# Patient Record
Sex: Female | Born: 1960 | Race: White | Hispanic: No | Marital: Single | Smoking: Former smoker
Health system: Southern US, Community
[De-identification: ages and names within clinical notes are randomized; demographics above are authoritative.]

---

## 2006-06-03 ENCOUNTER — Emergency Department: Payer: Self-pay | Admitting: Emergency Medicine

## 2007-07-26 ENCOUNTER — Emergency Department: Payer: Self-pay | Admitting: Emergency Medicine

## 2008-03-23 IMAGING — CR NASAL BONES - 3+ VIEW
1 series · 3 of 3 positions shown · non-contrast
Comparison: none

REASON FOR EXAM: TRIPPED ON HER CAT AND INJURED HER NOSE MINOR CARE ROOM
TWO
COMMENTS:

PROCEDURE:     DXR - DXR NASAL BONES  - June 03, 2006 [DATE]
RESULT:     No prior studies are available for comparison.

[Series 1: view not recorded · 0.17mm/px · 3 of 3 slices shown]
[im 1/3]
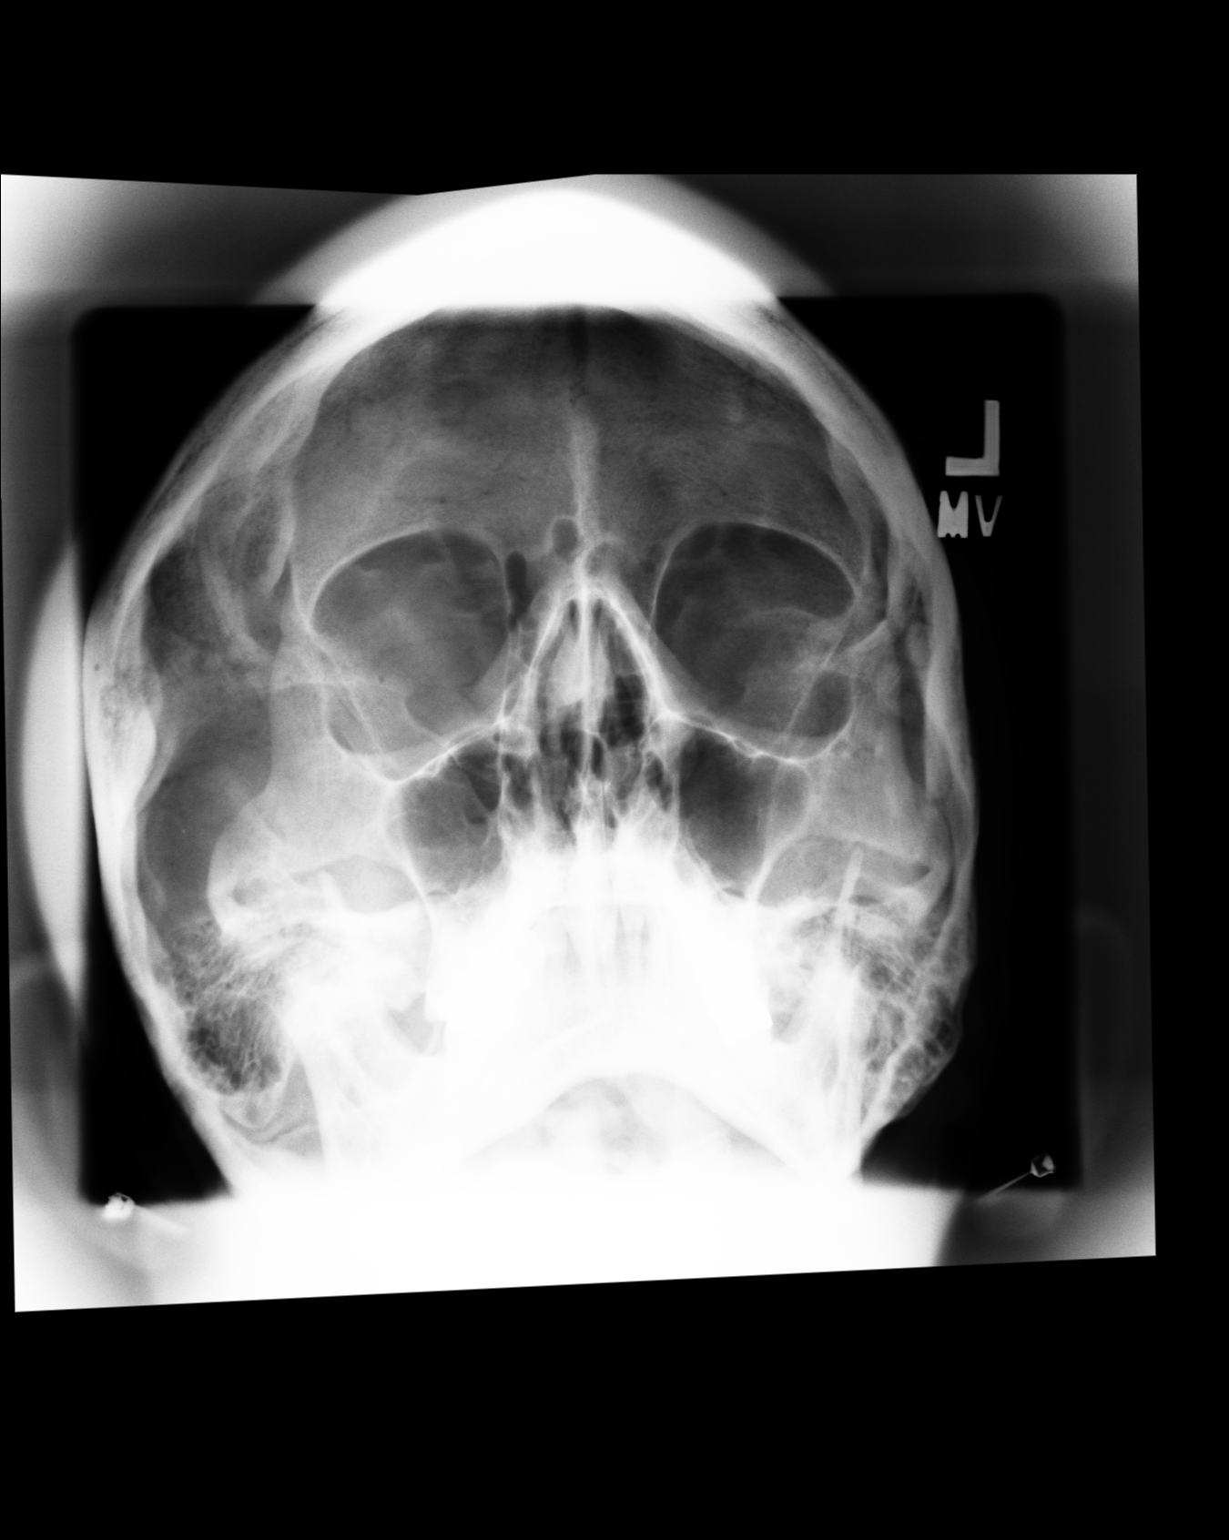
[im 2/3]
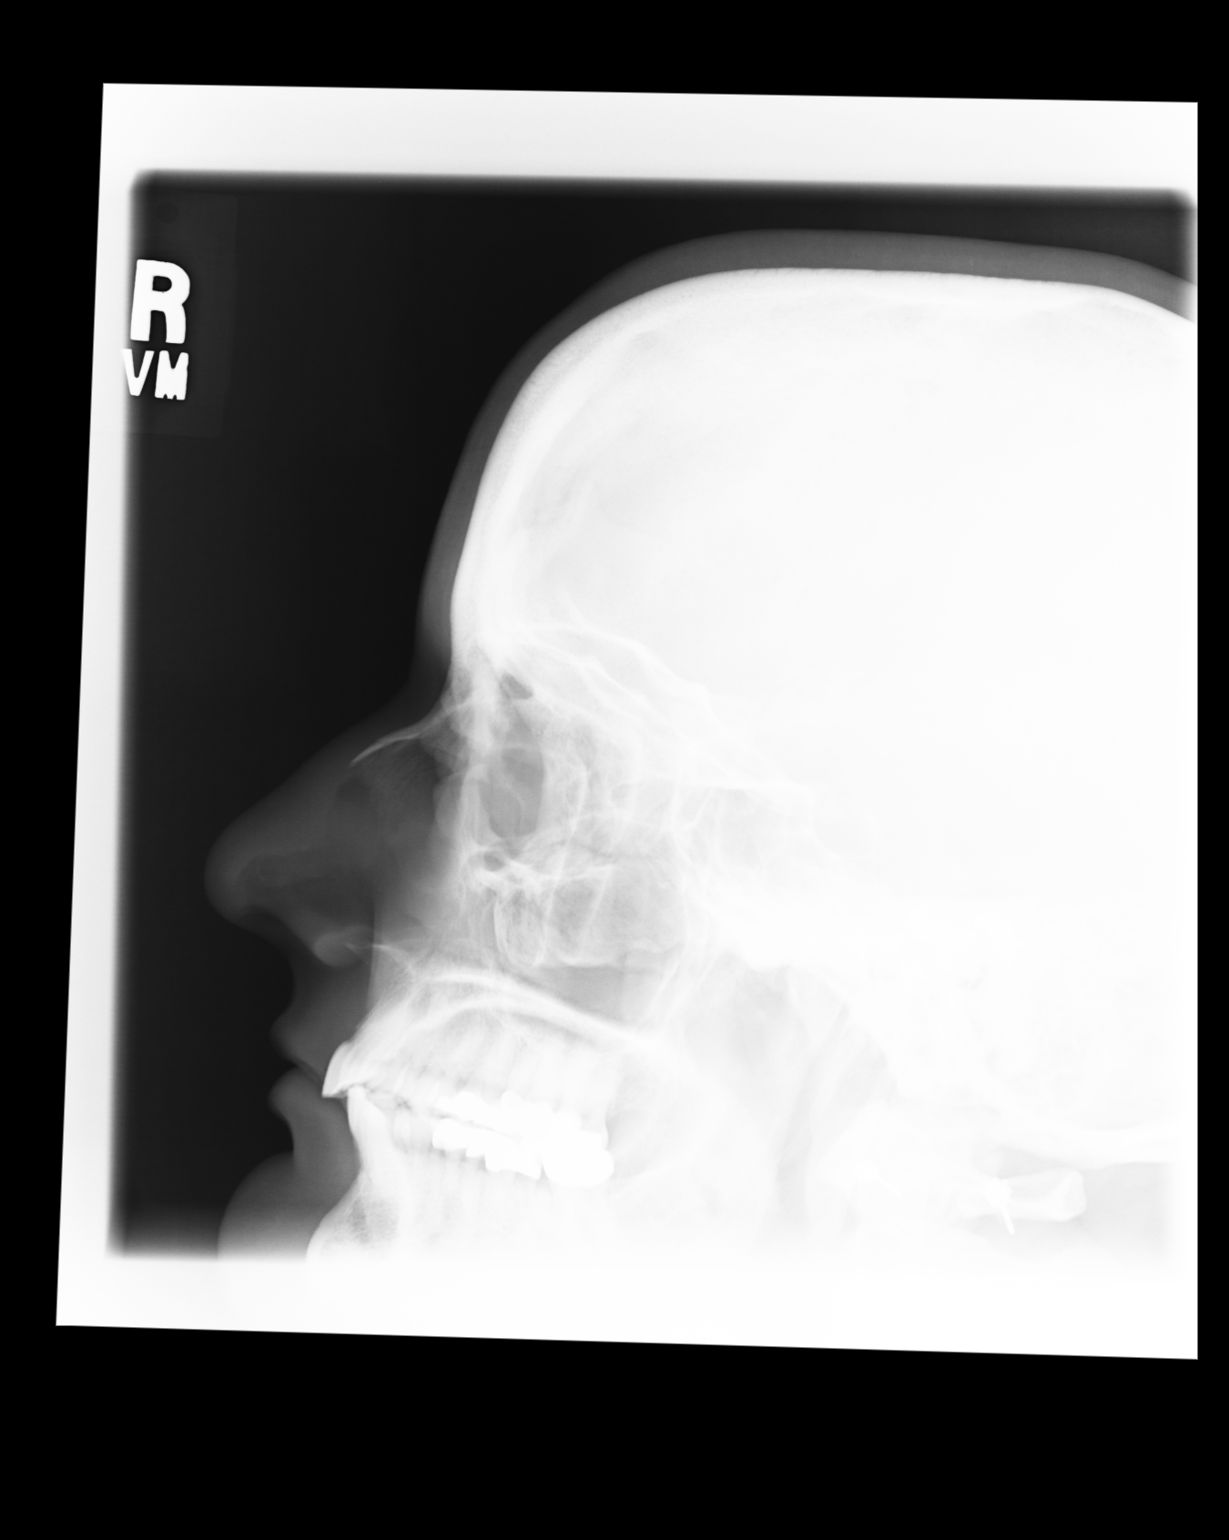
[im 3/3]
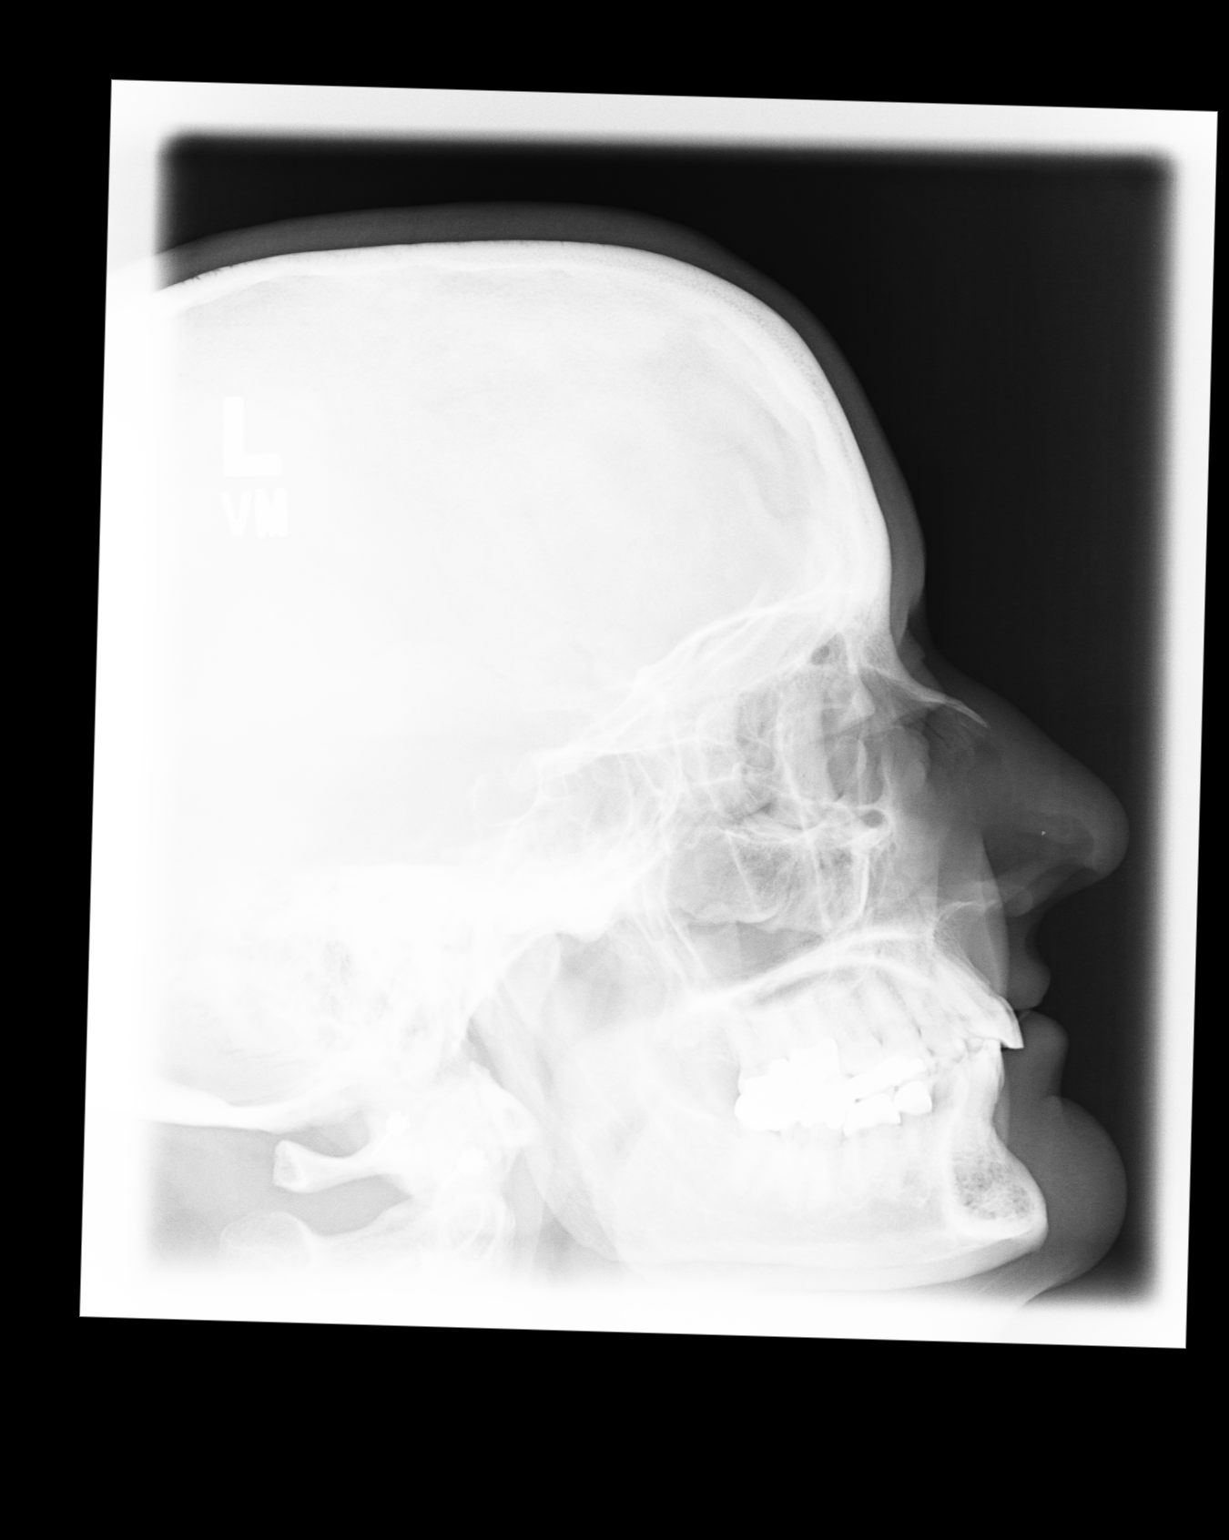

[3 of 3 positions shown; findings below may reference images not displayed]

FINDINGS: No displaced nasal fracture is identified. A small linear lucency
overlying the nasal bone may reflect a non-displaced fracture.  No other
bony abnormalities are identified. No soft tissue abnormalities are seen.
IMPRESSION: 1)No displaced nasal bone fracture is identified.

## 2014-10-21 ENCOUNTER — Emergency Department
Admission: EM | Admit: 2014-10-21 | Discharge: 2014-10-21 | Discharge status: Against Medical Advice | Payer: No Typology Code available for payment source | Attending: Emergency Medicine | Admitting: Emergency Medicine

## 2014-10-21 ENCOUNTER — Encounter: Payer: Self-pay | Admitting: *Deleted

## 2014-10-21 ENCOUNTER — Emergency Department: Payer: No Typology Code available for payment source

## 2014-10-21 DIAGNOSIS — Z87891 Personal history of nicotine dependence: Secondary | ICD-10-CM | POA: Insufficient documentation

## 2014-10-21 DIAGNOSIS — Y9389 Activity, other specified: Secondary | ICD-10-CM | POA: Insufficient documentation

## 2014-10-21 DIAGNOSIS — Z79899 Other long term (current) drug therapy: Secondary | ICD-10-CM | POA: Insufficient documentation

## 2014-10-21 DIAGNOSIS — W01198A Fall on same level from slipping, tripping and stumbling with subsequent striking against other object, initial encounter: Secondary | ICD-10-CM | POA: Diagnosis not present

## 2014-10-21 DIAGNOSIS — W19XXXA Unspecified fall, initial encounter: Secondary | ICD-10-CM

## 2014-10-21 DIAGNOSIS — S3991XA Unspecified injury of abdomen, initial encounter: Secondary | ICD-10-CM | POA: Insufficient documentation

## 2014-10-21 DIAGNOSIS — Y998 Other external cause status: Secondary | ICD-10-CM | POA: Diagnosis not present

## 2014-10-21 DIAGNOSIS — Y92009 Unspecified place in unspecified non-institutional (private) residence as the place of occurrence of the external cause: Secondary | ICD-10-CM | POA: Insufficient documentation

## 2014-10-21 DIAGNOSIS — S8991XA Unspecified injury of right lower leg, initial encounter: Secondary | ICD-10-CM | POA: Diagnosis not present

## 2014-10-21 DIAGNOSIS — S299XXA Unspecified injury of thorax, initial encounter: Secondary | ICD-10-CM | POA: Diagnosis not present

## 2014-10-21 DIAGNOSIS — R0781 Pleurodynia: Secondary | ICD-10-CM

## 2014-10-21 MED ORDER — TRAMADOL HCL 50 MG PO TABS
50.0000 mg | ORAL_TABLET | Freq: Once | ORAL | Status: AC
Start: 2014-10-21 — End: 2014-10-21
  Administered 2014-10-21: 50 mg via ORAL
  Filled 2014-10-21: qty 1

## 2014-10-21 MED ORDER — MORPHINE SULFATE 4 MG/ML IJ SOLN
4.0000 mg | Freq: Once | INTRAMUSCULAR | Status: DC
  Filled 2014-10-21: qty 1

## 2014-10-21 MED ORDER — TRAMADOL HCL 50 MG PO TABS: 50.0000 mg | ORAL_TABLET | Freq: Four times a day (QID) | ORAL | Status: AC | PRN

## 2014-10-21 MED ORDER — ONDANSETRON HCL 4 MG/2ML IJ SOLN
4.0000 mg | Freq: Once | INTRAMUSCULAR | Status: DC
  Filled 2014-10-21: qty 2

## 2014-10-21 NOTE — ED Notes (Addendum)
Pt was painting today and tripped over a cord and fell.  Pt has pain in left anterior ribs.  Pt has right knee pain.  No loc.  No chest pain.  No sob.  No bruising noted.  No swelling of right knee.

## 2014-10-21 NOTE — Discharge Instructions (Signed)
Chest Wall Pain Chest wall pain is pain in or around the bones and muscles of your chest. It may take up to 6 weeks to get better. It may take longer if you must stay physically active in your work and activities.  CAUSES  Chest wall pain may happen on its own. However, it may be caused by:  A viral illness like the flu.  Injury.  Coughing.  Exercise.  Arthritis.  Fibromyalgia.  Shingles. HOME CARE INSTRUCTIONS   Avoid overtiring physical activity. Try not to strain or perform activities that cause pain. This includes any activities using your chest or your abdominal and side muscles, especially if heavy weights are used.  Put ice on the sore area.  Put ice in a plastic bag.  Place a towel between your skin and the bag.  Leave the ice on for 15-20 minutes per hour while awake for the first 2 days.  Only take over-the-counter or prescription medicines for pain, discomfort, or fever as directed by your caregiver. SEEK IMMEDIATE MEDICAL CARE IF:   Your pain increases, or you are very uncomfortable.  You have a fever.  Your chest pain becomes worse.  You have new, unexplained symptoms.  You have nausea or vomiting.  You feel sweaty or lightheaded.  You have a cough with phlegm (sputum), or you cough up blood. MAKE SURE YOU:   Understand these instructions.  Will watch your condition.  Will get help right away if you are not doing well or get worse. Document Released: 03/27/2005 Document Revised: 06/19/2011 Document Reviewed: 11/21/2010 [facility] Patient Information 2015 [place], [place]. This information is not intended to replace advice given to you by your health care provider. Make sure you discuss any questions you have with your health care provider.  Fall Prevention and Home Safety Falls cause injuries and can affect all age groups. It is possible to use preventive measures to significantly decrease the likelihood of falls. There are many simple measures which  can make your home safer and prevent falls. OUTDOORS  Repair cracks and edges of walkways and driveways.  Remove high doorway thresholds.  Trim shrubbery on the main path into your home.  Have good outside lighting.  Clear walkways of tools, rocks, debris, and clutter.  Check that handrails are not broken and are securely fastened. Both sides of steps should have handrails.  Have leaves, snow, and ice cleared regularly.  Use sand or salt on walkways during winter months.  In the garage, clean up grease or oil spills. BATHROOM  Install night lights.  Install grab bars by the toilet and in the tub and shower.  Use non-skid mats or decals in the tub or shower.  Place a plastic non-slip stool in the shower to sit on, if needed.  Keep floors dry and clean up all water on the floor immediately.  Remove soap buildup in the tub or shower on a regular basis.  Secure bath mats with non-slip, double-sided rug tape.  Remove throw rugs and tripping hazards from the floors. BEDROOMS  Install night lights.  Make sure a bedside light is easy to reach.  Do not use oversized bedding.  Keep a telephone by your bedside.  Have a firm chair with side arms to use for getting dressed.  Remove throw rugs and tripping hazards from the floor. KITCHEN  Keep handles on pots and pans turned toward the center of the stove. Use back burners when possible.  Clean up spills quickly and allow time  for drying.  Avoid walking on wet floors.  Avoid hot utensils and knives.  Position shelves so they are not too high or low.  Place commonly used objects within easy reach.  If necessary, use a sturdy step stool with a grab bar when reaching.  Keep electrical cables out of the way.  Do not use floor polish or wax that makes floors slippery. If you must use wax, use non-skid floor wax.  Remove throw rugs and tripping hazards from the floor. STAIRWAYS  Never leave objects on  stairs.  Place handrails on both sides of stairways and use them. Fix any loose handrails. Make sure handrails on both sides of the stairways are as long as the stairs.  Check carpeting to make sure it is firmly attached along stairs. Make repairs to worn or loose carpet promptly.  Avoid placing throw rugs at the top or bottom of stairways, or properly secure the rug with carpet tape to prevent slippage. Get rid of throw rugs, if possible.  Have an electrician put in a light switch at the top and bottom of the stairs. OTHER FALL PREVENTION TIPS  Wear low-heel or rubber-soled shoes that are supportive and fit well. Wear closed toe shoes.  When using a stepladder, make sure it is fully opened and both spreaders are firmly locked. Do not climb a closed stepladder.  Add color or contrast paint or tape to grab bars and handrails in your home. Place contrasting color strips on first and last steps.  Learn and use mobility aids as needed. Install an electrical emergency response system.  Turn on lights to avoid dark areas. Replace light bulbs that burn out immediately. Get light switches that glow.  Arrange furniture to create clear pathways. Keep furniture in the same place.  Firmly attach carpet with non-skid or double-sided tape.  Eliminate uneven floor surfaces.  Select a carpet pattern that does not visually hide the edge of steps.  Be aware of all pets. OTHER HOME SAFETY TIPS  Set the water temperature for 120 F (48.8 C).  Keep emergency numbers on or near the telephone.  Keep smoke detectors on every level of the home and near sleeping areas. Document Released: 03/17/2002 Document Revised: 09/26/2011 Document Reviewed: 06/16/2011 [employer] Patient Information 2015 [place], [place]. This information is not intended to replace advice given to you by your health care provider. Make sure you discuss any questions you have with your health care provider.  Musculoskeletal  Pain Musculoskeletal pain is muscle and boney aches and pains. These pains can occur in any part of the body. Your caregiver may treat you without knowing the cause of the pain. They may treat you if blood or urine tests, X-rays, and other tests were normal.  CAUSES There is often not a definite cause or reason for these pains. These pains may be caused by a type of germ (virus). The discomfort may also come from overuse. Overuse includes working out too hard when your body is not fit. Boney aches also come from weather changes. Bone is sensitive to atmospheric pressure changes. HOME CARE INSTRUCTIONS   Ask when your test results will be ready. Make sure you get your test results.  Only take over-the-counter or prescription medicines for pain, discomfort, or fever as directed by your caregiver. If you were given medications for your condition, do not drive, operate machinery or power tools, or sign legal documents for 24 hours. Do not drink alcohol. Do not take sleeping pills or other  medications that may interfere with treatment.  Continue all activities unless the activities cause more pain. When the pain lessens, slowly resume normal activities. Gradually increase the intensity and duration of the activities or exercise.  During periods of severe pain, bed rest may be helpful. Lay or sit in any position that is comfortable.  Putting ice on the injured area.  Put ice in a bag.  Place a towel between your skin and the bag.  Leave the ice on for 15 to 20 minutes, 3 to 4 times a day.  Follow up with your caregiver for continued problems and no reason can be found for the pain. If the pain becomes worse or does not go away, it may be necessary to repeat tests or do additional testing. Your caregiver may need to look further for a possible cause. SEEK IMMEDIATE MEDICAL CARE IF:  You have pain that is getting worse and is not relieved by medications.  You develop chest pain that is associated  with shortness or breath, sweating, feeling sick to your stomach (nauseous), or throw up (vomit).  Your pain becomes localized to the abdomen.  You develop any new symptoms that seem different or that concern you. MAKE SURE YOU:   Understand these instructions.  Will watch your condition.  Will get help right away if you are not doing well or get worse. Document Released: 03/27/2005 Document Revised: 06/19/2011 Document Reviewed: 11/29/2012 [facility] Patient Information 2015 [place], [place]. This information is not intended to replace advice given to you by your health care provider. Make sure you discuss any questions you have with your health care provider.

## 2014-10-21 NOTE — ED Notes (Addendum)
Attempted to place IV.  IV blew.  Pt states she wants to leave, refusing for another nurse to attempt IV.  Dr. Zenda AlpersWebster informed.  Pt wanting to leave AMA.  Pt currently waiting in room for Dr. Zenda AlpersWebster to speak to her prior to signing AMA papers.

## 2014-10-21 NOTE — ED Notes (Signed)
Dr. Zenda AlpersWebster and Blenda MountsJeanina, RN at bedside

## 2014-10-21 NOTE — ED Notes (Signed)
Dr. Zenda AlpersWebster in room to discuss with patient the risk of leaving hospital prior to CT exams and blood work. Patient insisting on leaving, states "yes I know the risk." Patient verbalized understanding of signing out of hospital against medical advice.

## 2014-10-21 NOTE — ED Notes (Signed)
Pt reports fall and hurt right knee and left ribs.  Pt reports more pain in ribs.  Pt reports she tripped and fell.  Pt NAD at this time.

## 2014-10-21 NOTE — ED Notes (Signed)

## 2014-10-21 NOTE — ED Provider Notes (Addendum)
[facility] Emergency Department Provider Note  ____________________________________________  Time seen: Approximately 4:35 AM  I have reviewed the triage vital signs and the nursing notes.   HISTORY  Chief Complaint Fall and Rib Injury    HPI Patricia Cochran is a 54 y.o. female fell this evening. The patient reports that she tripped over a cord and hit her right knee and her left chest on impact. The patient reports that she lost her breath for a moment and then struggled to get up. She reports that she took out the trash and went back into her house and sat down. She reports that when she then tried to get back up she was unable to do so. The patient reports that she has immense pain in her left chest. She reports that she thought she could go to bed and follow-up in the morning but the pain was worsening so she decided to come in for evaluation. The patient reports that she took 500 mg of Naprosyn but it did not help. She reports that when she sits still the pain is not that bad but when she moves the pain is severe 10 out of 10 pain.   History reviewed. No pertinent past medical history.  There are no active problems to display for this patient.   History reviewed. No pertinent past surgical history.  Current Outpatient Rx  Name  Route  Sig  Dispense  Refill  . albuterol (PROVENTIL HFA;VENTOLIN HFA) 108 (90 BASE) MCG/ACT inhaler   Inhalation   Inhale 1 puff into the lungs every 6 (six) hours as needed for wheezing or shortness of breath.         . chlorthalidone (HYGROTON) 25 MG tablet   Oral   Take 25 mg by mouth daily.         . fluticasone (FLONASE) 50 MCG/ACT nasal spray   Each Nare   Place 1 spray into both nostrils daily as needed for allergies or rhinitis.         . metoprolol succinate (TOPROL-XL) 100 MG 24 hr tablet   Oral   Take 100 mg by mouth daily. Take with or immediately following a meal.         . omeprazole  (PRILOSEC) 40 MG capsule   Oral   Take 40 mg by mouth daily.         . pravastatin (PRAVACHOL) 20 MG tablet   Oral   Take 20 mg by mouth daily.         . traMADol (ULTRAM) 50 MG tablet   Oral   Take 1 tablet (50 mg total) by mouth every 6 (six) hours as needed.   12 tablet   0     Allergies Review of patient's allergies indicates no known allergies.  History reviewed. No pertinent family history.  Social History History  Substance Use Topics  . Smoking status: Former Games developermoker  . Smokeless tobacco: Not on file  . Alcohol Use: Yes    Review of Systems Constitutional: No fever/chills Eyes: No visual changes. ENT: No sore throat. Cardiovascular: Left chest pain Respiratory: Denies shortness of breath. Gastrointestinal: No abdominal pain.  No nausea, no vomiting.  No diarrhea.  No constipation. Genitourinary: Negative for dysuria. Musculoskeletal: Negative for back pain. Skin: Negative for rash. Neurological: Negative for headaches, focal weakness or numbness.  10-point ROS otherwise negative.  ____________________________________________   PHYSICAL EXAM:  VITAL SIGNS: ED Triage Vitals  Enc Vitals Group  BP 10/21/14 0147 144/93 mmHg     Pulse Rate 10/21/14 0316 71     Resp 10/21/14 0147 22     Temp 10/21/14 0147 97.8 F (36.6 C)     Temp Source 10/21/14 0147 Oral     SpO2 10/21/14 0147 100 %     Weight 10/21/14 0147 250 lb (113.399 kg)     Height 10/21/14 0147 5\' 7"  (1.702 m)     Head Cir --      Peak Flow --      Pain Score 10/21/14 0148 10     Pain Loc --      Pain Edu? --      Excl. in GC? --     Constitutional: Alert and oriented. Well appearing and in moderate distress. Eyes: Conjunctivae are normal. PERRL. EOMI. Head: Atraumatic. Nose: No congestion/rhinnorhea. Mouth/Throat: Mucous membranes are moist.  Oropharynx non-erythematous. Chest: Tender to palpation over left lower chest wall no bruising noticed Cardiovascular: Normal rate,  regular rhythm. Grossly normal heart sounds.  Good peripheral circulation. Respiratory: Normal respiratory effort.  No retractions. Lungs CTAB. Gastrointestinal: Soft with left upper quadrant tenderness to palpation. No distention. Active bowel sounds Genitourinary: Deferred Musculoskeletal: Swelling of right knee with mild lateral tenderness palpation no pain with range of motion movement.   Neurologic:  Normal speech and language. No gross focal neurologic deficits are appreciated.  Skin:  Skin is warm, dry and intact. No rash noted. Psychiatric: Mood and affect are normal.   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  I considered this to be leaving against medical advice. I personally discussed the following with them:  1)  That they currently had a medical condition of chest wall and abdomen pain and I am concerned that they may have splenic injury.   2)  My proposed course of evaluation and treatment includes, but is not limited to,  Blood work and CT scan of abdomen and pelvis to evaluate patient's spleen.  Benefits of staying include possible diagnosis of splenic injury, which if identified early would lead to appropriate intervention in a timely manner lessening the burden of disability and death.  3) Risks of leaving before this had been completed include: misdiagnosis, worsening  illness leading up to and including prolonged or permanent disability or death.    Despite this they stated they wanted to leave due to feeling that she is fine and fear of needles and refused further evaluation, treatment, at this time.   They appeared clinically sober, were mentating appropriately, were free from distracting injury, had adequately controlled acute pain, appeared to have intact insight, judgment, and reason, and in my opinion had the capacity to make this decision.  Specifically, they were able to verbally state back in a coherent manner their current medical condition/current diagnosis, the proposed course of evaluation and/or treatment, and the risks, benefits, and alternatives of treatment versus leaving against medical advice.   They understand that they may return to seek medical attention here at ANY time they want.  I strongly advised  them to return to the Emergency Department immediately if they experience any new or worsening symptoms that concern them, or simply if they reconsider continued evaluation and/or treatment as previously discussed.  This would be without any repercussions, though they understand they likely will need to wait again in the Emergency Department if other patients are in front of them, rather than being brought straight back.  They understood this is another advantage of staying, but still insisted upon leaving.  I recommended they follow-up with his primary care physician at the earliest available opportunity/appointment for further evaluation and treatment.   The patient was discharged against medical advice.  They did accept written discharge instructions.  ____________________________________________   INITIAL IMPRESSION / ASSESSMENT AND PLAN / ED COURSE  Pertinent labs & imaging results that were available during my care of the patient were reviewed by me and considered in my medical decision making (see chart for details).  This is  54 year old female who comes in with a fall and injury to her right knee and left chest wall. On exam the patient did have some left upper quadrant tenderness palpation. I informed her that the concern would be that she may have a splenic injury. She reports that it hurts mildly but she is more concerned about her chest. Patient did receive a chest x-ray did not show any fracture, pulmonary contusion or pneumothorax. The patient reports that she would allow an attempt at one IV. The IV attempt was necessary and the patient became very upset reports that she wanted to go home. I did go in and discussed the patient that we are unable to evaluate for possible splenic injury given her pain. The patient reports that she feels as though she is fine, she understands the risks of leaving and still wants to leave. I informed the patient that since I'm unable to determine the cause of this abdominal pain that she would need to leave AGAINST MEDICAL ADVICE. The patient reports that she did agree to do that. She'll be given a dose of tramadol for pain and we discharged home to follow-up with her primary care physician or [facility] acute care clinic. ____________________________________________   FINAL CLINICAL IMPRESSION(S) / ED DIAGNOSES  Final diagnoses:  Fall, initial encounter  Rib pain on left side      Rebecka Apley, MD 10/21/14 0445  Rebecka Apley, MD 10/21/14 [phone]

## 2016-08-10 IMAGING — CR DG KNEE 1-2V*R*
1 series · 2 of 2 positions shown · non-contrast
Comparison: None.

CLINICAL DATA: Trip and fall injury landing on hardwood floor.

EXAM:
RIGHT KNEE - 1-2 VIEW

[Series 1: dg knee 1-2 views right · 0.14mm/px · 2 of 2 slices shown]
[im 1/2]
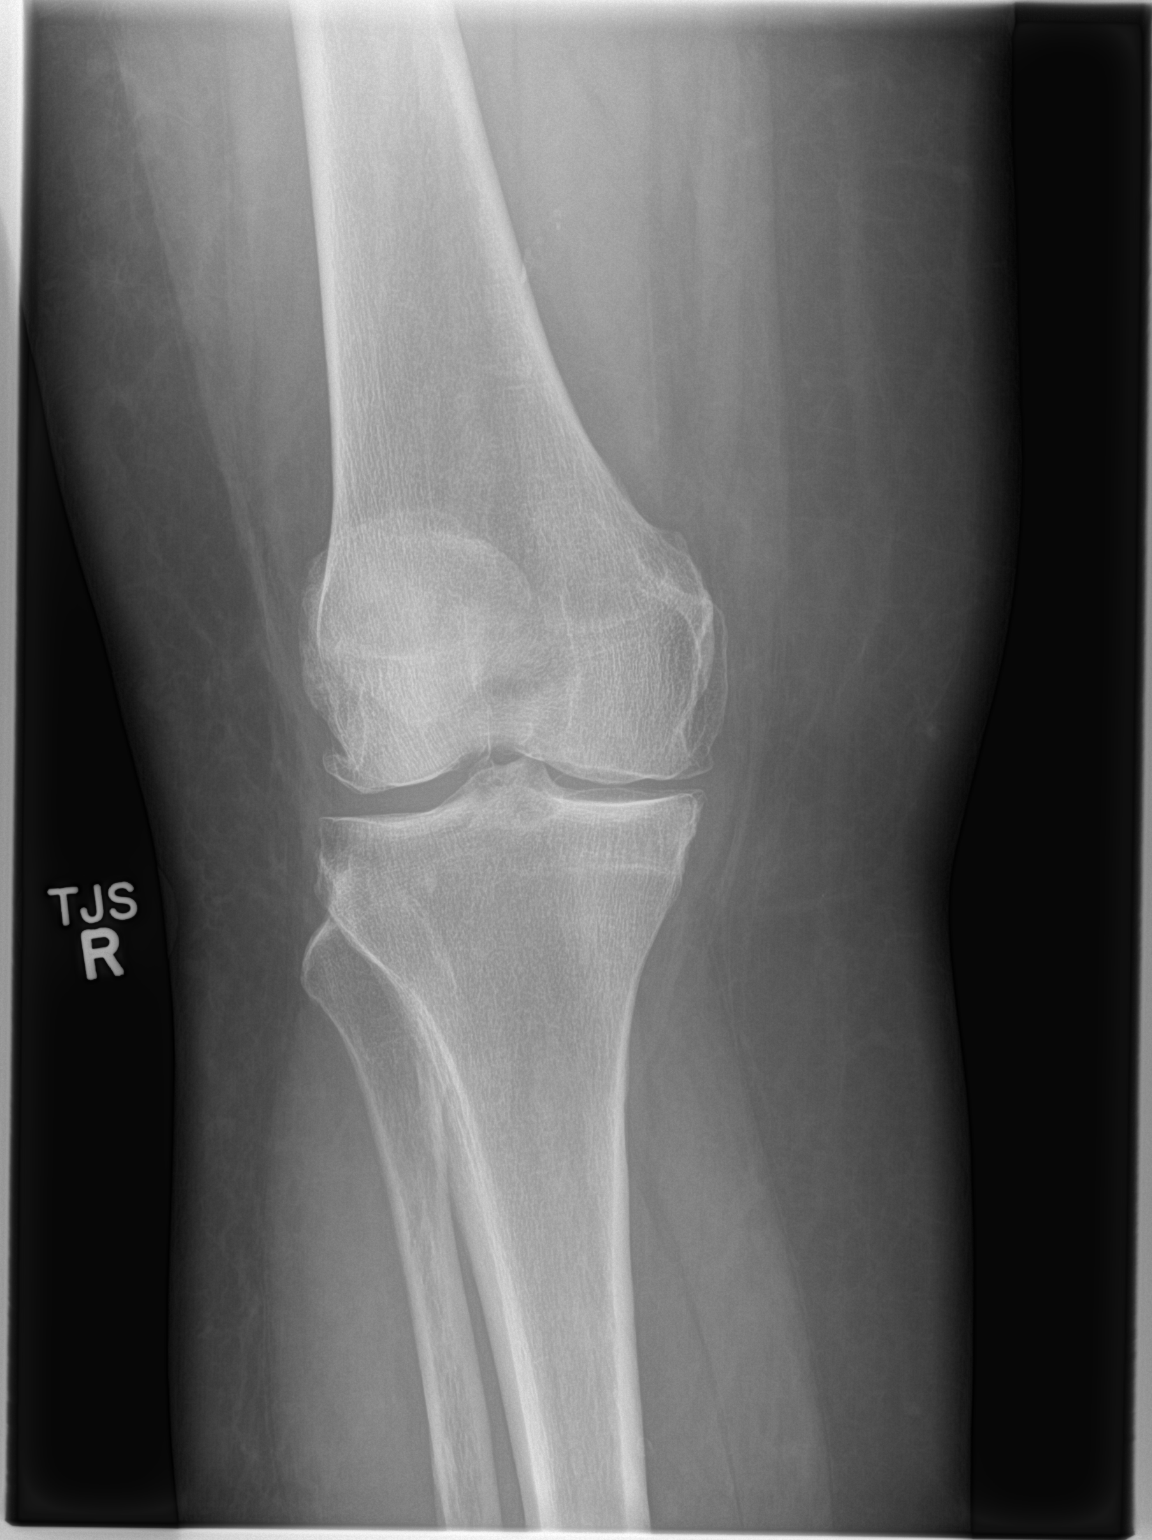
[im 2/2]
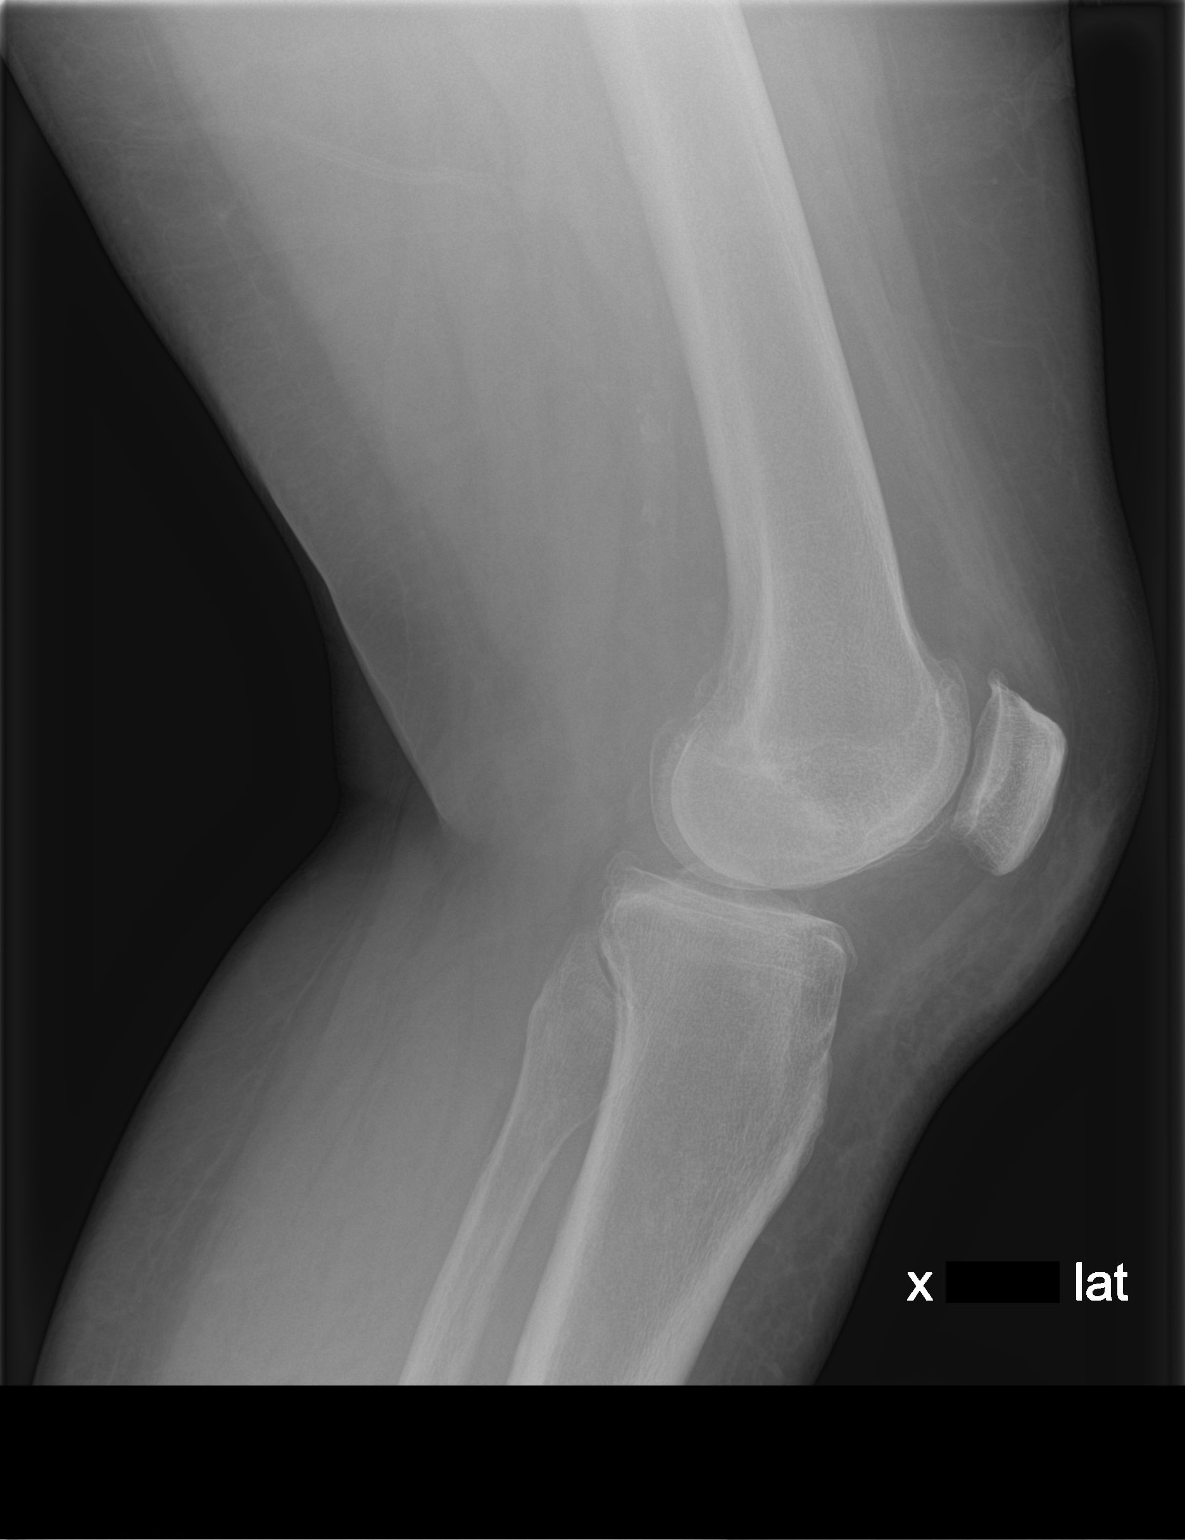

[2 of 2 positions shown; findings below may reference images not displayed]

FINDINGS: Degenerative changes in the right knee with medial greater than
lateral compartment narrowing and tricompartmental osteophytosis. No
evidence of acute fracture or dislocation. No focal bone lesion or
bone destruction. Bone cortex appears intact. No significant
effusion.
IMPRESSION: Tricompartment degenerative changes in the right knee. No acute bony
abnormalities.

## 2016-08-10 IMAGING — CR DG RIBS W/ CHEST 3+V*L*
1 series · 5 of 5 positions shown · non-contrast
Comparison: None.

CLINICAL DATA: Trip and fall injury at 10 p.m.. Left anterior rib
pain. Severe pain with difficulty breathing.

EXAM:
LEFT RIBS AND CHEST - 3+ VIEW

[Series 1: dg ribs unilateral w/chest left · 0.14mm/px · 5 of 5 slices shown]
[im 1/5]
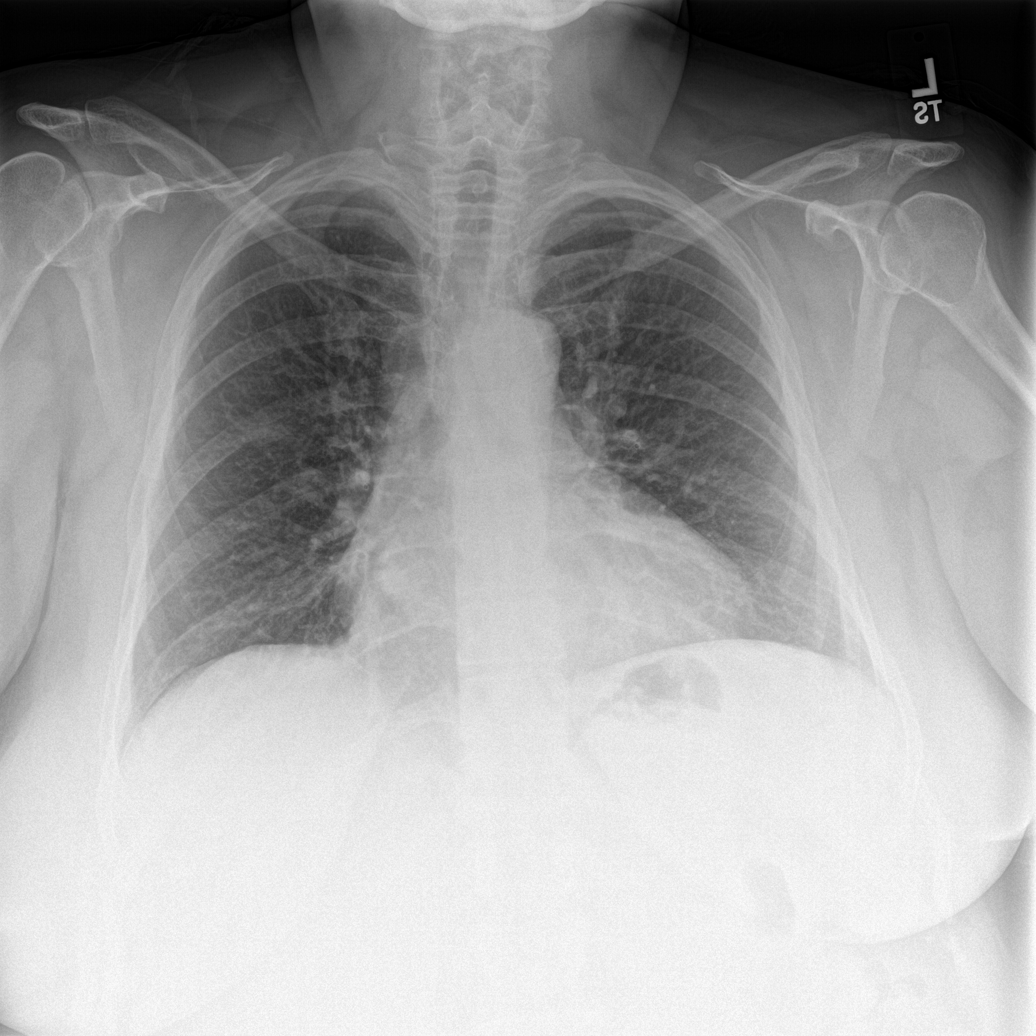
[im 2/5]
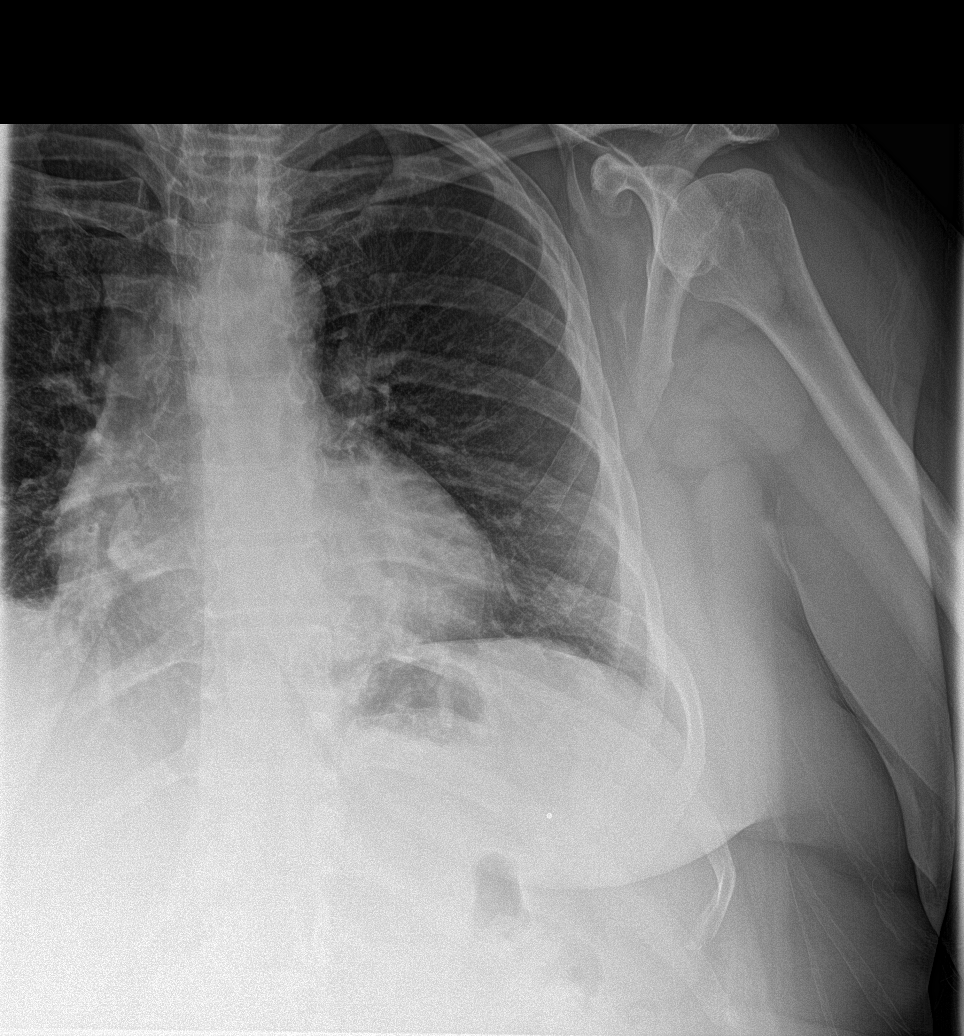
[im 3/5]
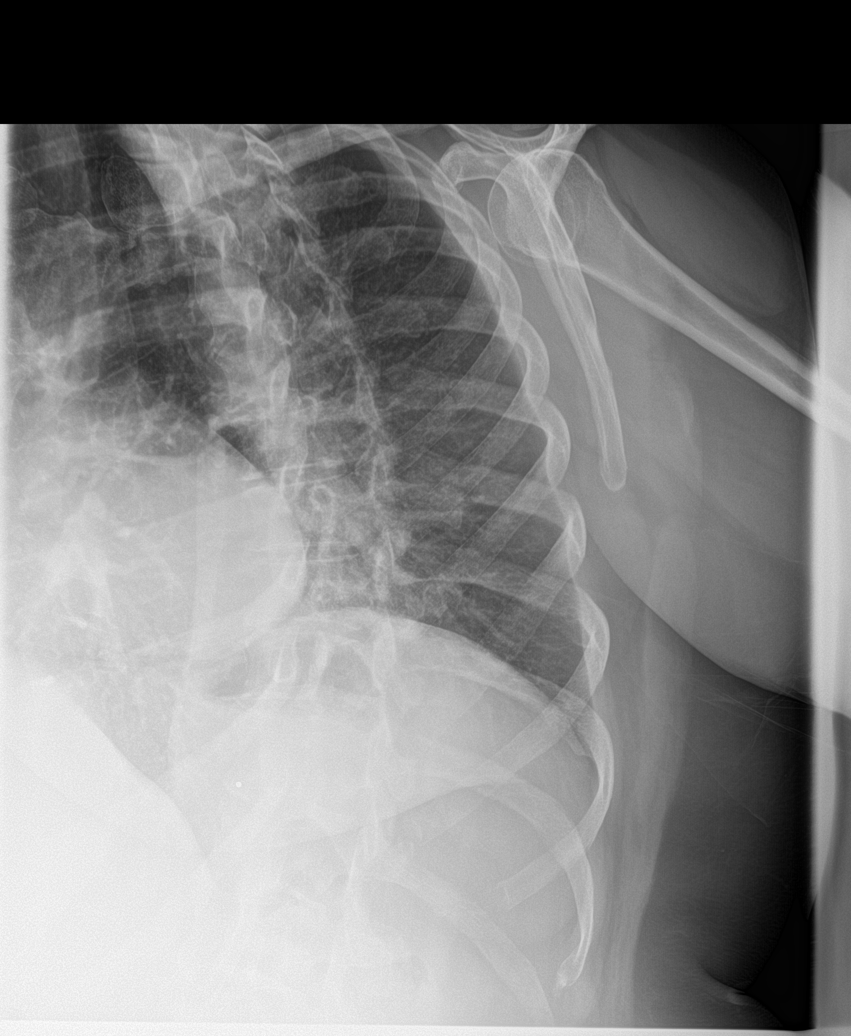
[im 4/5]
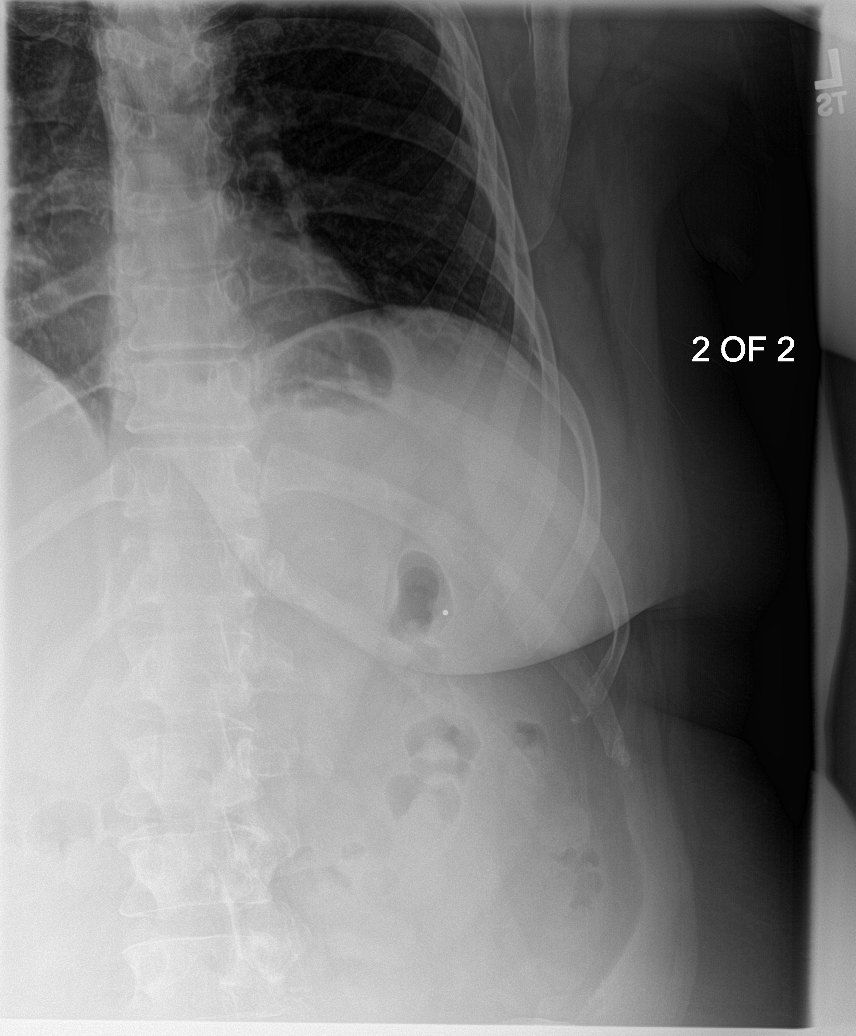
[im 5/5]
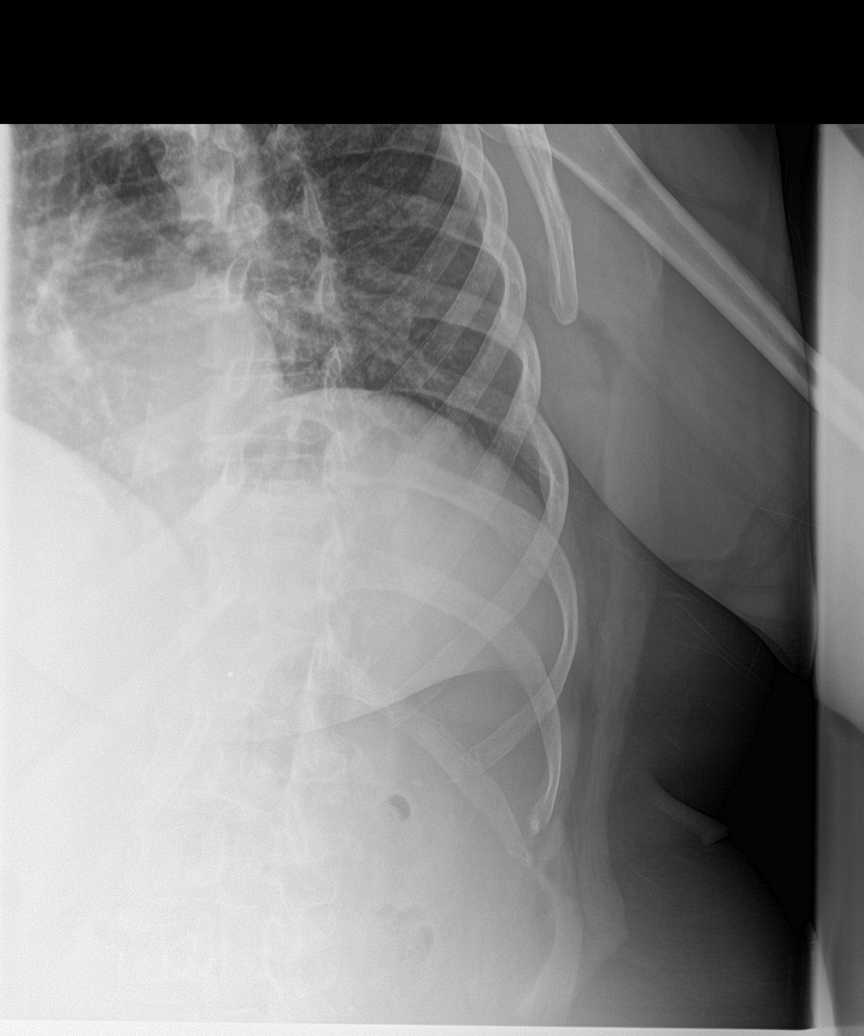

[5 of 5 positions shown; findings below may reference images not displayed]

FINDINGS: Normal heart size and pulmonary vascularity. No focal airspace
disease or consolidation in the lungs. No blunting of costophrenic
angles. No pneumothorax. Mediastinal contours appear intact.

Left ribs appear intact. No acute displaced fractures or focal bone
lesions identified.
IMPRESSION: No evidence of active pulmonary disease.  Normal left ribs.
